# Patient Record
Sex: Female | Born: 1965 | Hispanic: Yes | Marital: Single
Health system: Southern US, Community
[De-identification: ages and names within clinical notes are randomized; demographics above are authoritative.]

---

## 2019-08-04 ENCOUNTER — Emergency Department: Payer: Self-pay

## 2019-08-04 ENCOUNTER — Other Ambulatory Visit: Payer: Self-pay

## 2019-08-04 ENCOUNTER — Encounter: Payer: Self-pay | Admitting: Emergency Medicine

## 2019-08-04 ENCOUNTER — Emergency Department
Admission: EM | Admit: 2019-08-04 | Discharge: 2019-08-04 | Disposition: A | Discharge status: Home / Self Care | Payer: Self-pay | Attending: Student in an Organized Health Care Education/Training Program | Admitting: Student in an Organized Health Care Education/Training Program

## 2019-08-04 DIAGNOSIS — R112 Nausea with vomiting, unspecified: Secondary | ICD-10-CM | POA: Insufficient documentation

## 2019-08-04 DIAGNOSIS — Z20828 Contact with and (suspected) exposure to other viral communicable diseases: Secondary | ICD-10-CM | POA: Insufficient documentation

## 2019-08-04 DIAGNOSIS — R42 Dizziness and giddiness: Secondary | ICD-10-CM | POA: Insufficient documentation

## 2019-08-04 DIAGNOSIS — R1011 Right upper quadrant pain: Secondary | ICD-10-CM | POA: Insufficient documentation

## 2019-08-04 DIAGNOSIS — R739 Hyperglycemia, unspecified: Secondary | ICD-10-CM | POA: Insufficient documentation

## 2019-08-04 LAB — LIPASE, BLOOD: Lipase: 18 U/L (ref 11–51)

## 2019-08-04 LAB — COMPREHENSIVE METABOLIC PANEL
ALT: 16 U/L (ref 0–44)
AST: 17 U/L (ref 15–41)
Albumin: 3.9 g/dL (ref 3.5–5.0)
Alkaline Phosphatase: 85 U/L (ref 38–126)
Anion gap: 12 (ref 5–15)
BUN: 25 mg/dL — ABNORMAL HIGH (ref 6–20)
CO2: 29 mmol/L (ref 22–32)
Calcium: 10 mg/dL (ref 8.9–10.3)
Chloride: 99 mmol/L (ref 98–111)
Creatinine, Ser: 0.84 mg/dL (ref 0.44–1.00)
GFR calc Af Amer: >60 mL/min (ref >60)
GFR calc non Af Amer: >60 mL/min (ref >60)
Glucose, Bld: 355 mg/dL — ABNORMAL HIGH (ref 70–99)
Potassium: 4.4 mmol/L (ref 3.5–5.1)
Sodium: 140 mmol/L (ref 135–145)
Total Bilirubin: 1 mg/dL (ref 0.3–1.2)
Total Protein: 8.2 g/dL — ABNORMAL HIGH (ref 6.5–8.1)

## 2019-08-04 LAB — CBC
HCT: 40.5 % (ref 36.0–46.0)
Hemoglobin: 13.4 g/dL (ref 12.0–15.0)
MCH: 28 pg (ref 26.0–34.0)
MCHC: 33.1 g/dL (ref 30.0–36.0)
MCV: 84.7 fL (ref 80.0–100.0)
Platelets: 292 10*3/uL (ref 150–400)
RBC: 4.78 MIL/uL (ref 3.87–5.11)
RDW: 12.2 % (ref 11.5–15.5)
WBC: 8.4 10*3/uL (ref 4.0–10.5)
nRBC: 0 % (ref 0.0–0.2)

## 2019-08-04 LAB — URINALYSIS, COMPLETE (UACMP) WITH MICROSCOPIC
Bilirubin Urine: NEGATIVE
Glucose, UA: >=500 mg/dL — AB
Ketones, ur: 5 mg/dL — AB
Nitrite: NEGATIVE
Protein, ur: 100 mg/dL — AB
Specific Gravity, Urine: 1.028 (ref 1.005–1.030)
pH: 5 (ref 5.0–8.0)

## 2019-08-04 LAB — SARS CORONAVIRUS 2 BY RT PCR (HOSPITAL ORDER, PERFORMED IN ~~LOC~~ HOSPITAL LAB): SARS Coronavirus 2: NEGATIVE

## 2019-08-04 LAB — GLUCOSE, CAPILLARY: Glucose-Capillary: 331 mg/dL — ABNORMAL HIGH (ref 70–99)

## 2019-08-04 MED ORDER — NITROFURANTOIN MONOHYD MACRO 100 MG PO CAPS: 100.0000 mg | ORAL_CAPSULE | Freq: Two times a day (BID) | ORAL | 0 refills | Status: AC

## 2019-08-04 MED ORDER — METOCLOPRAMIDE HCL 5 MG/ML IJ SOLN
10.0000 mg | Freq: Once | INTRAMUSCULAR | Status: AC
  Administered 2019-08-04: 10 mg via INTRAVENOUS
  Filled 2019-08-04: qty 2

## 2019-08-04 MED ORDER — PROMETHAZINE HCL 12.5 MG PO TABS: 12.5000 mg | ORAL_TABLET | Freq: Four times a day (QID) | ORAL | 0 refills | Status: AC | PRN

## 2019-08-04 MED ORDER — SODIUM CHLORIDE 0.9% FLUSH: 3.0000 mL | Freq: Once | INTRAVENOUS | Status: DC

## 2019-08-04 MED ORDER — SODIUM CHLORIDE 0.9 % IV BOLUS
1000.0000 mL | Freq: Once | INTRAVENOUS | Status: AC
  Administered 2019-08-04: 1000 mL via INTRAVENOUS

## 2019-08-04 MED ORDER — MECLIZINE HCL 25 MG PO TABS: 25.0000 mg | ORAL_TABLET | Freq: Three times a day (TID) | ORAL | 0 refills | Status: AC | PRN

## 2019-08-04 MED ORDER — METFORMIN HCL 500 MG PO TABS: 500.0000 mg | ORAL_TABLET | Freq: Every day | ORAL | 0 refills | Status: AC

## 2019-08-04 MED ORDER — MECLIZINE HCL 25 MG PO TABS
25.0000 mg | ORAL_TABLET | Freq: Once | ORAL | Status: AC
  Administered 2019-08-04: 25 mg via ORAL
  Filled 2019-08-04: qty 1

## 2019-08-04 NOTE — ED Notes (Signed)
Video interpretor to bedside for [place] tech use

## 2019-08-04 NOTE — ED Provider Notes (Addendum)
[facility] Emergency Department Provider Note    First MD Initiated Contact with Patient 08/04/19 1536     (approximate)  I have reviewed the triage vital signs and the nursing notes.   HISTORY  Chief Complaint Dizziness and Emesis    HPI Cheryl Ball is a 53 y.o. female presents the ER for evaluation of 24 hours of nausea and lightheadedness.  Has had multiple episodes of vomiting.  Denies any measured fevers cough or shortness of breath.  Did not note any abdominal pain but during examination did note some right upper quadrant abdominal pain.  Denies any pain radiating through to her back.  Denies anyone being sick at home.    History reviewed. No pertinent past medical history. No family history on file. History reviewed. No pertinent surgical history. There are no active problems to display for this patient.     Prior to Admission medications   Medication Sig Start Date End Date Taking? Authorizing Provider  metFORMIN (GLUCOPHAGE) 500 MG tablet Take 1 tablet (500 mg total) by mouth daily with breakfast. 08/04/19 08/03/20  Willy Eddyobinson, Jolan Upchurch, MD  nitrofurantoin, macrocrystal-monohydrate, (MACROBID) 100 MG capsule Take 1 capsule (100 mg total) by mouth 2 (two) times daily for 3 days. 08/04/19 08/07/19  Willy Eddyobinson, Denario Bagot, MD  promethazine (PHENERGAN) 12.5 MG tablet Take 1 tablet (12.5 mg total) by mouth every 6 (six) hours as needed. 08/04/19   Willy Eddyobinson, Girtrude Enslin, MD    Allergies Patient has no known allergies.    Social History Social History   Tobacco Use  . Smoking status: Not on file  . Smokeless tobacco: Never Used  Substance Use Topics  . Alcohol use: Not Currently  . Drug use: Never    Review of Systems Patient denies headaches, rhinorrhea, blurry vision, numbness, shortness of breath, chest pain, edema, cough, abdominal pain, nausea, vomiting, diarrhea, dysuria, fevers, rashes or hallucinations unless otherwise stated above  in HPI. ____________________________________________   PHYSICAL EXAM:  VITAL SIGNS: Vitals:   08/04/19 1841 08/04/19 2111  BP: 134/72 (!) 141/82  Pulse: (!) 104 99  Resp: 17 16  Temp:    SpO2: 98% 98%    Constitutional: Alert and oriented.  Eyes: Conjunctivae are normal.  Head: Atraumatic. Nose: No congestion/rhinnorhea. Mouth/Throat: Mucous membranes are moist.   Neck: No stridor. Painless ROM.  Cardiovascular: Normal rate, regular rhythm. Grossly normal heart sounds.  Good peripheral circulation. Respiratory: Normal respiratory effort.  No retractions. Lungs CTAB. Gastrointestinal: Soft with mild ruq ttp. No distention. No abdominal bruits. No CVA tenderness. Genitourinary: deferred Musculoskeletal: No lower extremity tenderness nor edema.  No joint effusions. Neurologic:  Normal speech and language. No gross focal neurologic deficits are appreciated. No facial droop Skin:  Skin is warm, dry and intact. No rash noted. Psychiatric: Mood and affect are normal. Speech and behavior are normal.  ____________________________________________   LABS (all labs ordered are listed, but only abnormal results are displayed)  Results for orders placed or performed during the hospital encounter of 08/04/19 (from the past 24 hour(s))  Glucose, capillary     Status: Abnormal   Collection Time: 08/04/19  2:09 PM  Result Value Ref Range   Glucose-Capillary 331 (H) 70 - 99 mg/dL  Lipase, blood     Status: None   Collection Time: 08/04/19  2:10 PM  Result Value Ref Range   Lipase 18 11 - 51 U/L  Comprehensive metabolic panel     Status: Abnormal   Collection Time: 08/04/19  2:10  PM  Result Value Ref Range   Sodium 140 135 - 145 mmol/L   Potassium 4.4 3.5 - 5.1 mmol/L   Chloride 99 98 - 111 mmol/L   CO2 29 22 - 32 mmol/L   Glucose, Bld 355 (H) 70 - 99 mg/dL   BUN 25 (H) 6 - 20 mg/dL   Creatinine, Ser 1.610.84 0.44 - 1.00 mg/dL   Calcium 09.610.0 8.9 - 04.510.3 mg/dL   Total Protein 8.2 (H)  6.5 - 8.1 g/dL   Albumin 3.9 3.5 - 5.0 g/dL   AST 17 15 - 41 U/L   ALT 16 0 - 44 U/L   Alkaline Phosphatase 85 38 - 126 U/L   Total Bilirubin 1.0 0.3 - 1.2 mg/dL   GFR calc non Af Amer >60 >60 mL/min   GFR calc Af Amer >60 >60 mL/min   Anion gap 12 5 - 15  CBC     Status: None   Collection Time: 08/04/19  2:10 PM  Result Value Ref Range   WBC 8.4 4.0 - 10.5 K/uL   RBC 4.78 3.87 - 5.11 MIL/uL   Hemoglobin 13.4 12.0 - 15.0 g/dL   HCT 40.940.5 81.136.0 - 91.446.0 %   MCV 84.7 80.0 - 100.0 fL   MCH 28.0 26.0 - 34.0 pg   MCHC 33.1 30.0 - 36.0 g/dL   RDW 78.212.2 95.611.5 - 21.315.5 %   Platelets 292 150 - 400 K/uL   nRBC 0.0 0.0 - 0.2 %  Urinalysis, Complete w Microscopic     Status: Abnormal   Collection Time: 08/04/19  2:10 PM  Result Value Ref Range   Color, Urine YELLOW (A) YELLOW   APPearance CLOUDY (A) CLEAR   Specific Gravity, Urine 1.028 1.005 - 1.030   pH 5.0 5.0 - 8.0   Glucose, UA >=500 (A) NEGATIVE mg/dL   Hgb urine dipstick SMALL (A) NEGATIVE   Bilirubin Urine NEGATIVE NEGATIVE   Ketones, ur 5 (A) NEGATIVE mg/dL   Protein, ur 086100 (A) NEGATIVE mg/dL   Nitrite NEGATIVE NEGATIVE   Leukocytes,Ua TRACE (A) NEGATIVE   RBC / HPF 0-5 0 - 5 RBC/hpf   WBC, UA 11-20 0 - 5 WBC/hpf   Bacteria, UA RARE (A) NONE SEEN   Squamous Epithelial / LPF 11-20 0 - 5   Mucus PRESENT   SARS Coronavirus 2 [facility](Hospital order, Performed in [facility] hospital lab) Nasopharyngeal Nasopharyngeal Swab     Status: None   Collection Time: 08/04/19  4:13 PM   Specimen: Nasopharyngeal Swab  Result Value Ref Range   SARS Coronavirus 2 NEGATIVE NEGATIVE   ____________________________________________  EKG My review and personal interpretation at Time: 14:10   Indication: n/v  Rate: 90  Rhythm: sinus Axis: normal  Other: normal intervals, no stemi ____________________________________________  RADIOLOGY  I personally reviewed all radiographic images ordered to evaluate for the above acute complaints and reviewed  radiology reports and findings.  These findings were personally discussed with the patient.  Please see medical record for radiology report.  ____________________________________________   PROCEDURES  Procedure(s) performed:  Procedures    Critical Care performed: no ____________________________________________   INITIAL IMPRESSION / ASSESSMENT AND PLAN / ED COURSE  Pertinent labs & imaging results that were available during my care of the patient were reviewed by me and considered in my medical decision making (see chart for details).   DDX: cholelithiasis, cholecystits, enteritis, sbo, uti, pyelo, covid [address]19  Cheryl Ball is a [age] y.o. who presents to the  ED with symptoms of lightheadedness dizziness as well as nausea and vomiting over the past 24 hours.  Does have some mild tenderness in the right upper quadrant therefore will order ultrasound.  She seems very concerned that her blood sugar is elevated and does not have a history or diagnosis of diabetes.  Does not have PCP.  Will check blood work.  We will give IV hydration as well as antiemetic.  No right lower quadrant pain or back pain or flank pain to suggest stone or appendicitis.  Patient also without any white count.    Clinical Course as of Aug 03 2121  [facility] Aug 04, 2019  1759 Patient reassessed with significant improvement after IV fluids.  Repeat abdominal exam soft and benign.  This not consistent with acute appendicitis.  Not consistent with stone or pancreatitis.  She is putting increased frequency and given trace leukocytes numerous whites with rare bacteria will send for urine culture and treat.   [PR]  1924 Upon just prior to discharge patient started feeling worsening dizziness.  On repeat examination noted to have bilateral nystagmus and a component of vertical nystagmus but no other neuro deficits.  Will be treated for vertigo but based on the vertical nystagmus will order MRI to exclude posterior CVA.    [PR]  2119 MRI does not show any evidence of acute abnormality.  No orthostasis.  Symptoms improved with Antivert.  Is consistent with vertigo.  Have discussed with the patient and available family all diagnostics and treatments performed thus far and all questions were answered to the best of my ability. The patient demonstrates understanding and agreement with plan.    [PR]    Clinical Course User Index [PR] Merlyn Lot, MD    The patient was evaluated in Emergency Department today for the symptoms described in the history of present illness. He/she was evaluated in the context of the global COVID-19 pandemic, which necessitated consideration that the patient might be at risk for infection with the SARS-CoV-2 virus that causes COVID-19. Institutional protocols and algorithms that pertain to the evaluation of patients at risk for COVID-19 are in a state of rapid change based on information released by regulatory bodies including the CDC and federal and state organizations. These policies and algorithms were followed during the patient's care in the ED.  As part of my medical decision making, I reviewed the following data within the [place] notes reviewed and incorporated, Labs reviewed, notes from prior ED visits and [place] Controlled Substance Database   ____________________________________________   FINAL CLINICAL IMPRESSION(S) / ED DIAGNOSES  Final diagnoses:  Nausea & vomiting  High blood sugar      NEW MEDICATIONS STARTED DURING THIS VISIT:  New Prescriptions   METFORMIN (GLUCOPHAGE) 500 MG TABLET    Take 1 tablet (500 mg total) by mouth daily with breakfast.   NITROFURANTOIN, MACROCRYSTAL-MONOHYDRATE, (MACROBID) 100 MG CAPSULE    Take 1 capsule (100 mg total) by mouth 2 (two) times daily for 3 days.   PROMETHAZINE (PHENERGAN) 12.5 MG TABLET    Take 1 tablet (12.5 mg total) by mouth every 6 (six) hours as needed.     Note:  This document was  prepared using Dragon voice recognition software and may include unintentional dictation errors.    Merlyn Lot, MD 08/04/19 Virl Cagey    Merlyn Lot, MD 08/04/19 2122

## 2019-08-04 NOTE — ED Notes (Signed)
Ambulated pt, pt states feels dizzy and stumbled once. Pt has told this RN that dizziness was better but now states that dizziness never went away. In person interpretor requested instead of the video interpretor that has been being used

## 2019-08-04 NOTE — ED Triage Notes (Signed)
Pt presents to ED via wheelchair from [facility] with c/o emesis and dizziness. Pt states dizziness since yesterday afternoon. Pt also c/o approx 10 episodes of vomiting since yesterday. Pt alert and oriented, visualized in NAD in triage.   Language line used for triage.

## 2019-08-04 NOTE — ED Notes (Signed)
PT given gingerale and crackers. States dizziness feels better.

## 2019-08-04 NOTE — ED Notes (Signed)
Pt in MRI.

## 2020-03-15 ENCOUNTER — Ambulatory Visit: Payer: Self-pay | Attending: Internal Medicine

## 2020-03-15 DIAGNOSIS — Z23 Encounter for immunization: Secondary | ICD-10-CM

## 2020-03-15 NOTE — Progress Notes (Signed)
   Covid-19 Vaccination Clinic  Name:  Donne Robillard    MRN: [id number] DOB: [birth year]  03/15/2020  Ms. Atira Borello was observed post Covid-19 immunization for 15 minutes without incident. She was provided with Vaccine Information Sheet and instruction to access the V-Safe system.   Ms. Kabella Cassidy was instructed to call 911 with any severe reactions post vaccine: Marland Kitchen Difficulty breathing  . Swelling of face and throat  . A fast heartbeat  . A bad rash all over body  . Dizziness and weakness   Immunizations Administered    Name Date Dose VIS Date Route   Pfizer COVID-19 Vaccine 03/15/2020  5:28 PM 0.3 mL 11/28/2019 Intramuscular   Manufacturer: [employer], [employer]   Lot: [id number]   NDC: [address]-8

## 2020-04-05 ENCOUNTER — Ambulatory Visit: Payer: Self-pay

## 2020-04-20 ENCOUNTER — Ambulatory Visit: Payer: Self-pay

## 2020-05-22 IMAGING — MR MRI HEAD WITHOUT CONTRAST
10 series · 48 of 48 positions shown · non-contrast
Comparison: None.

CLINICAL DATA: Ataxia.  Vomiting

EXAM:
MRI HEAD WITHOUT CONTRAST
TECHNIQUE: Multiplanar, multiecho pulse sequences of the brain and surrounding
structures were obtained without intravenous contrast.

[Series 2: ax dwi_tracew · axial · 3.0mm · 1.31mm/px · z∈[-83,+71]mm · 7 of 48 slices shown]
[im 1/48]
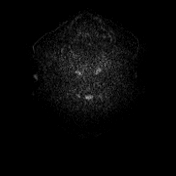
[im 8/48]
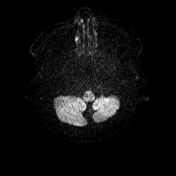
[im 16/48]
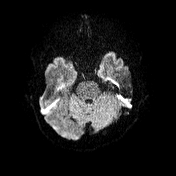
[im 24/48]
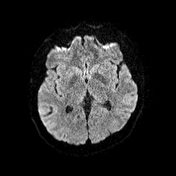
[im 32/48]
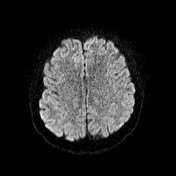
[im 40/48]
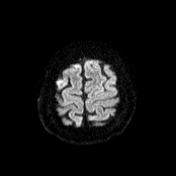
[im 48/48]
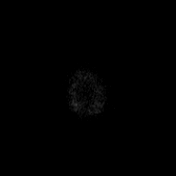

[Series 3: ax dwi_adc · axial · 3.0mm · 1.31mm/px · z∈[-83,+71]mm · 7 of 47 slices shown]
[im 1/47]
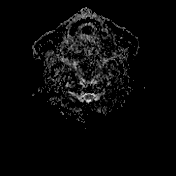
[im 8/47]
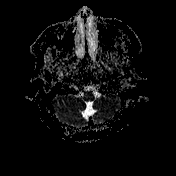
[im 16/47]
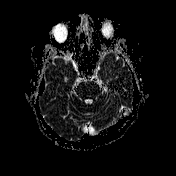
[im 24/47]
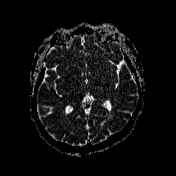
[im 31/47]
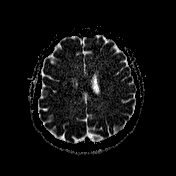
[im 39/47]
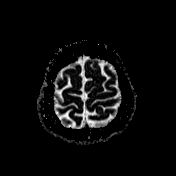
[im 47/47]
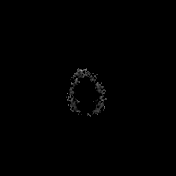

[Series 4: cor dwi_tracew · coronal · 5.0mm · 1.31mm/px · 5 of 34 slices shown]
[im 1/34]
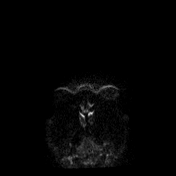
[im 9/34]
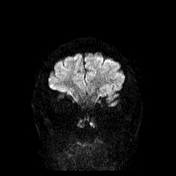
[im 17/34]
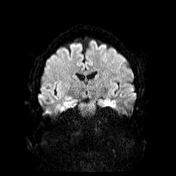
[im 25/34]
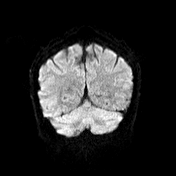
[im 34/34]
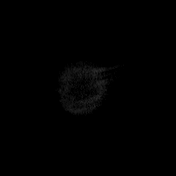

[Series 5: cor dwi_adc · coronal · 5.0mm · 1.31mm/px · 5 of 34 slices shown]
[im 1/34]
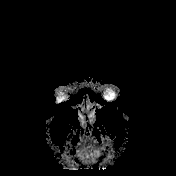
[im 9/34]
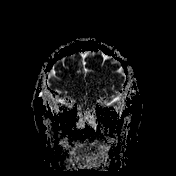
[im 17/34]
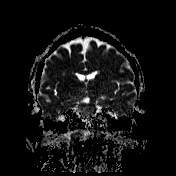
[im 25/34]
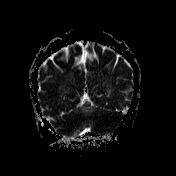
[im 34/34]
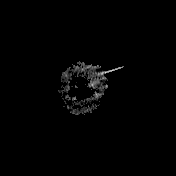

[Series 6: T1 · sagittal · 5.0mm · 0.94mm/px · 4 of 23 slices shown (1 of 2)]
[im 1/23]
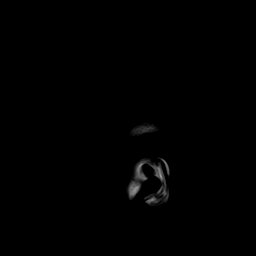
[im 8/23]
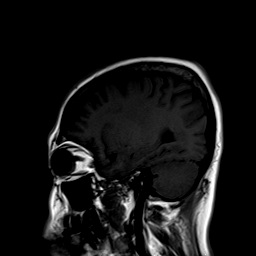
[im 15/23]
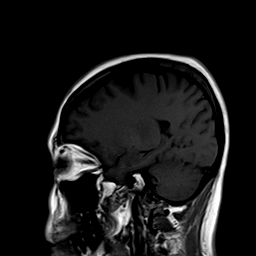
[im 23/23]
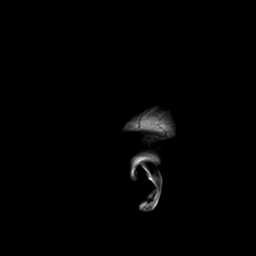

[Series 7: T2 · axial · 5.0mm · 0.45mm/px · z∈[-75,+69]mm · 4 of 25 slices shown (1 of 2)]
[im 1/25]
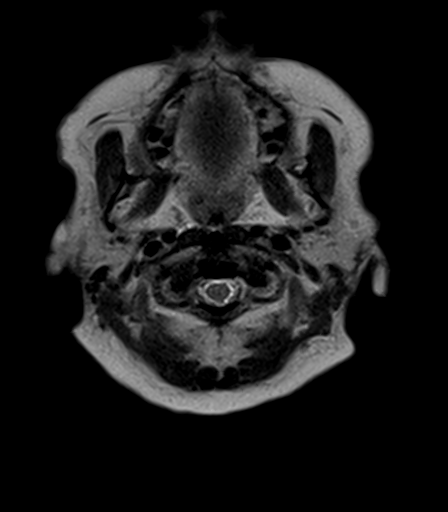
[im 9/25]
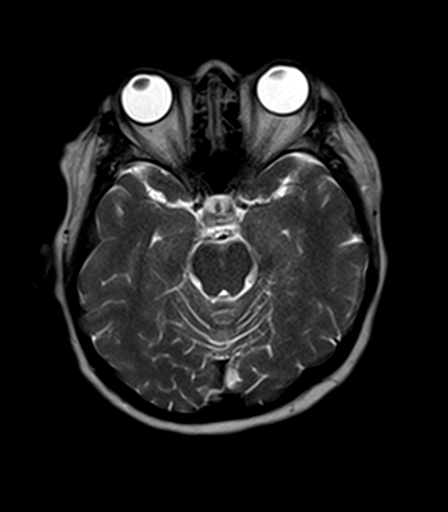
[im 17/25]
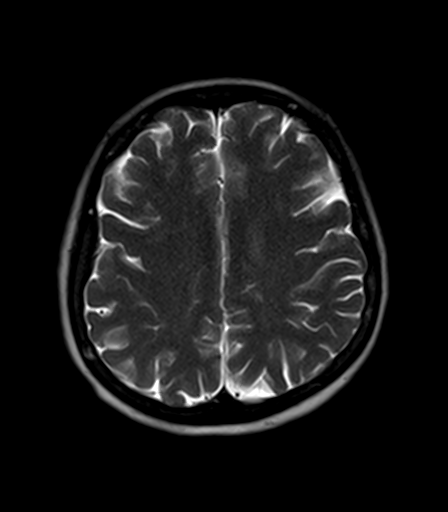
[im 25/25]
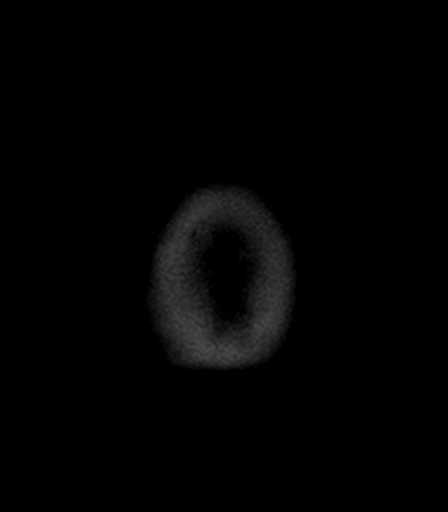

[Series 8: T2-star · axial · 5.0mm · 0.45mm/px · z∈[-75,+69]mm · 4 of 25 slices shown]
[im 1/25]
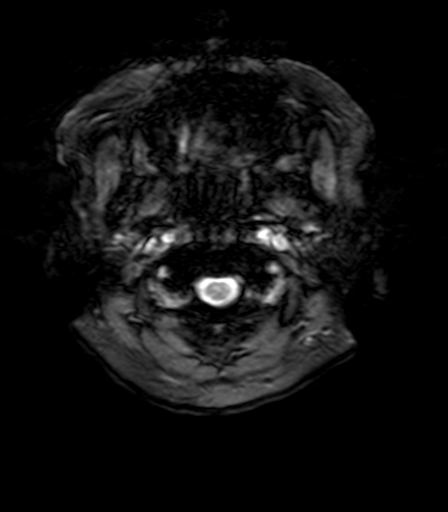
[im 9/25]
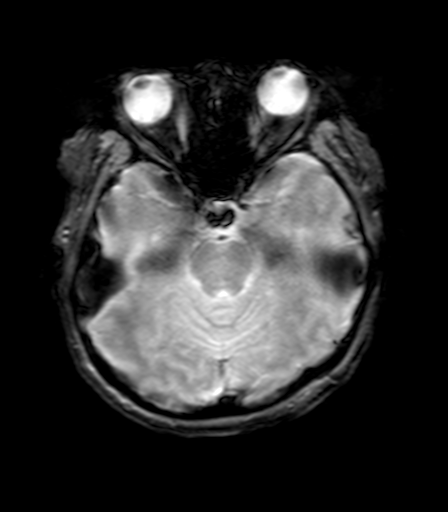
[im 17/25]
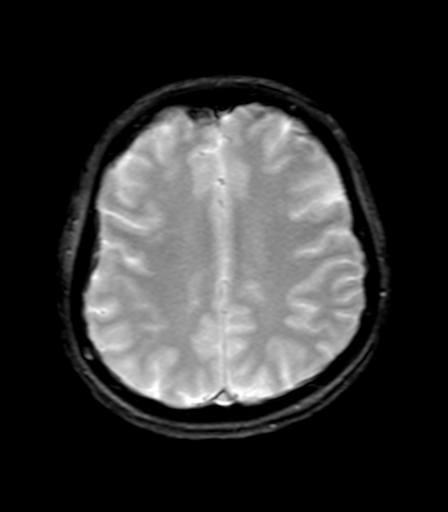
[im 25/25]
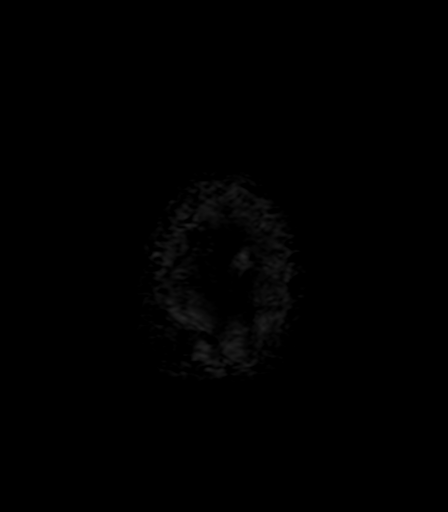

[Series 9: FLAIR · axial · 5.0mm · 1.20mm/px · z∈[-75,+68]mm · 4 of 25 slices shown]
[im 1/25]
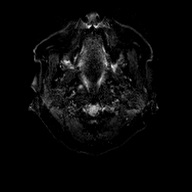
[im 9/25]
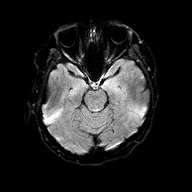
[im 17/25]
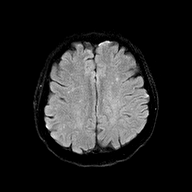
[im 25/25]
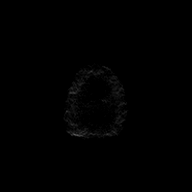

[Series 11: T2 · coronal · 5.0mm · 0.45mm/px · 4 of 28 slices shown (2 of 2)]
[im 1/28]
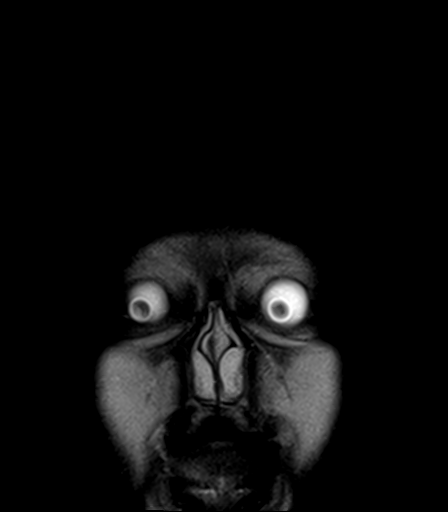
[im 10/28]
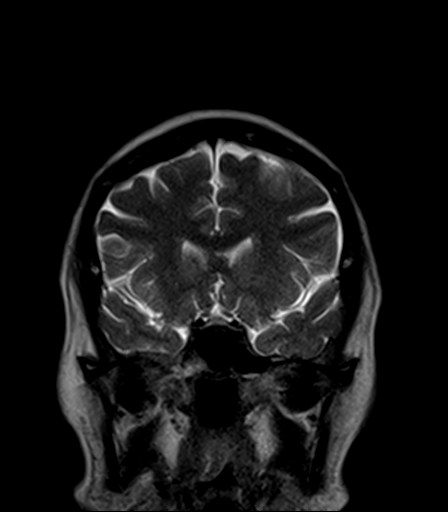
[im 19/28]
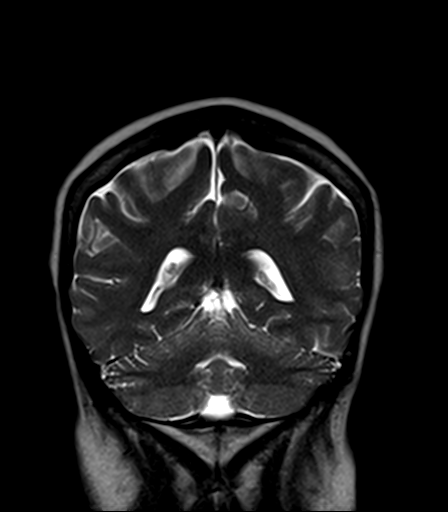
[im 28/28]
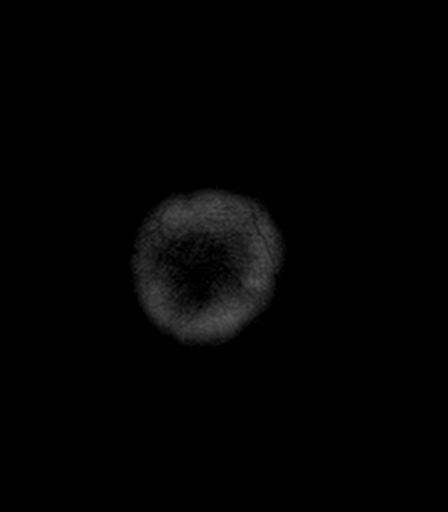

[Series 12: T1 · axial · 5.0mm · 0.90mm/px · z∈[-75,+69]mm · 4 of 25 slices shown (2 of 2)]
[im 1/25]
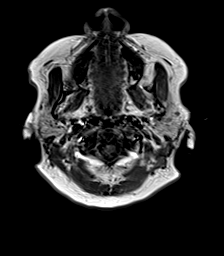
[im 9/25]
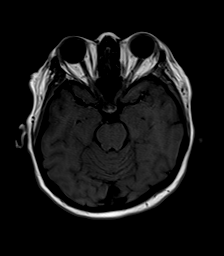
[im 17/25]
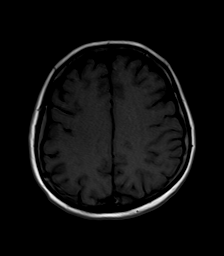
[im 25/25]
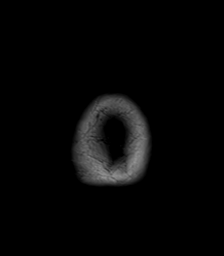

[48 of 48 positions shown; findings below may reference images not displayed]

FINDINGS: Brain: Ventricle size and cerebral volume normal. Negative for acute
infarct. Scattered small white matter hyperintensities bilaterally.
Negative for hemorrhage or mass.

Vascular: Normal arterial flow voids

Skull and upper cervical spine: Negative

Sinuses/Orbits: Mild mucosal edema paranasal sinuses.  Normal orbit

Other: None
IMPRESSION: No acute intracranial abnormality. Mild white matter changes most
likely chronic microvascular ischemia.

## 2021-01-04 ENCOUNTER — Ambulatory Visit: Payer: Self-pay
